# Patient Record
Sex: Female | Born: 2000 | Race: White | Hispanic: No | Marital: Single | Smoking: Never smoker
Health system: Southern US, Community
[De-identification: ages and names within clinical notes are randomized; demographics above are authoritative.]

## PROBLEM LIST (undated history)

## (undated) DIAGNOSIS — F819 Developmental disorder of scholastic skills, unspecified: Secondary | ICD-10-CM

## (undated) DIAGNOSIS — E301 Precocious puberty: Secondary | ICD-10-CM

## (undated) DIAGNOSIS — R55 Syncope and collapse: Secondary | ICD-10-CM

## (undated) DIAGNOSIS — Q828 Other specified congenital malformations of skin: Secondary | ICD-10-CM

## (undated) DIAGNOSIS — F419 Anxiety disorder, unspecified: Principal | ICD-10-CM

## (undated) HISTORY — DX: Syncope and collapse: R55

## (undated) HISTORY — DX: Developmental disorder of scholastic skills, unspecified: F81.9

## (undated) HISTORY — PX: NO PAST SURGERIES: SHX2092

## (undated) HISTORY — DX: Precocious puberty: E30.1

## (undated) HISTORY — DX: Other specified congenital malformations of skin: Q82.8

---
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
## 2018-03-23 ENCOUNTER — Other Ambulatory Visit: Payer: Self-pay | Admitting: Internal Medicine

## 2018-09-10 ENCOUNTER — Ambulatory Visit (INDEPENDENT_AMBULATORY_CARE_PROVIDER_SITE_OTHER): Payer: PRIVATE HEALTH INSURANCE | Admitting: Clinical

## 2018-09-10 DIAGNOSIS — F419 Anxiety disorder, unspecified: Secondary | ICD-10-CM

## 2022-01-03 LAB — HEMOGLOBIN A1C
Estimated Avg Glucose, External: 109 mg/dL (ref 91–123)
Estimated Avg Glucose: 109
Hemoglobin A1C, External: 5.4 % (ref 4.8–5.6)
Hemoglobin A1C: 5.4 %

## 2022-04-07 ENCOUNTER — Ambulatory Visit: Attending: Family Medicine

## 2022-04-07 NOTE — Progress Notes (Deleted)
Caitlin Lee (DOB:  May 06, 2000) is a 22 y.o. female,Established patient, here for evaluation of the following chief complaint(s):  No chief complaint on file.         ASSESSMENT/PLAN:  {There are no diagnoses linked to this encounter. (Refresh or delete this SmartLink)}    No follow-ups on file.         Subjective   SUBJECTIVE/OBJECTIVE:  HPI      Past Medical History:   Diagnosis Date    Anxiety     Memory loss     Obesity     Psoriasis      History reviewed. No pertinent surgical history.  No current outpatient medications on file.     No current facility-administered medications for this visit.         Allergies and Intolerances:   Not on File    Family History:   History reviewed. No pertinent family history.    Social History:   She  reports that she has never smoked. She has never used smokeless tobacco.  She  has no history on file for alcohol use.    Review of Systems       Objective  There were no vitals taken for this visit.    Physical Exam       {Time Documentation Optional:210461321}      An electronic signature was used to authenticate this note.    --Marijo File, MD

## 2022-07-02 ENCOUNTER — Encounter

## 2022-07-02 NOTE — Telephone Encounter (Signed)
Last Appointment:  Visit date not found  Future Appointments   Date Time Provider Department Center   11/07/2022 10:15 AM Barnabas Lister, Marcos Eke, MD [facility]

## 2022-07-04 MED ORDER — BUPROPION HCL ER (XL) 300 MG PO TB24
300 MG | ORAL_TABLET | Freq: Every morning | ORAL | 1 refills | Status: DC
Start: 2022-07-04 — End: 2023-02-06

## 2022-07-04 NOTE — Telephone Encounter (Signed)
Orders Placed This Encounter    buPROPion (WELLBUTRIN XL) 300 MG extended release tablet     Sig: Take 1 tablet by mouth every morning     Dispense:  90 tablet     Refill:  1

## 2022-11-07 ENCOUNTER — Ambulatory Visit
Admit: 2022-11-07 | Discharge: 2022-11-07 | Payer: BLUE CROSS/BLUE SHIELD | Attending: Family Medicine | Primary: Family Medicine

## 2022-11-07 DIAGNOSIS — F419 Anxiety disorder, unspecified: Secondary | ICD-10-CM

## 2022-11-07 NOTE — Progress Notes (Unsigned)
Caitlin Lee (DOB:  03/30/2000) is a 22 y.o. female,Established patient, here for evaluation of the following chief complaint(s):  Establish Care and New Patient         Assessment & Plan  Anxiety   {A/P Summary:[id number]}         Hypothyroidism, unspecified type   {A/P Summary:[id number]}         Elevated BP without diagnosis of hypertension   {A/P Summary:[id number]}           Return in about 6 months (around 05/08/2023) for Hypothyroidism, Elevated bp,  anxiety.       Subjective   HPI  22 year old female who presents to establish care.  Former patient of [employer] primary care.  Has a history of anxiety, psoriasis, hypothyroidism, and morbid obesity.        Patient Care Team:  Endocrinologist-   Truitt Leep, NP   [facility])  OB/GYN-   [facility])  Dermatologist          Anxiety----  On Wellbutrin XL  300 mg a day and lexapro 5 mg a day.    No mental health counselor.    She did not feel that this is necessary at this time.           Hypothyroidism--  Managed by Endocrinologist.     Elevated bp---  May be d/t being in the doctor's office.   Also needing to get to work on time.           Past Medical History:   Diagnosis Date    Anxiety     COVID-19 12/2020    and in 09/2022.    Had memory issues after the first COVID infection.    Memory loss     Obesity     Psoriasis      Past Surgical History:   Procedure Laterality Date    WISDOM TOOTH EXTRACTION  05/2019     Current Outpatient Medications   Medication Sig    escitalopram (LEXAPRO) 5 MG tablet Take 1 tablet by mouth daily    ALAYCEN 1/35 1-35 MG-MCG per tablet Take 1 tablet by mouth daily    levothyroxine (SYNTHROID) 25 MCG tablet Take 1 tablet by mouth daily    liothyronine (CYTOMEL) 5 MCG tablet Take 1 tablet by mouth daily    coal tar (NEUTROGENA T-GEL) 0.5 % shampoo Apply topically nightly as needed    buPROPion (WELLBUTRIN XL) 300 MG extended release tablet Take 1 tablet by mouth every morning     No  current facility-administered medications for this visit.         Allergies and Intolerances:   No Known Allergies    Family History:   No family history on file.    Social History:   She  reports that she has never smoked. She has never used smokeless tobacco.  She  has no history on file for alcohol use.        Review of Systems   Constitutional:  Negative for chills, fatigue and fever.   Eyes:  Negative for visual disturbance.   Respiratory:  Negative for cough and shortness of breath.    Cardiovascular:  Negative for chest pain, palpitations and leg swelling.   Gastrointestinal:  Negative for abdominal pain, blood in stool, nausea and vomiting.   Genitourinary:  Negative for difficulty urinating.   Neurological:  Negative for dizziness, numbness and headaches.  Objective   BP (!) 122/93 (Site: Right Upper Arm, Position: Sitting, Cuff Size: Medium Adult)   Pulse 93   Temp 97.2 F (36.2 C) (Temporal)   Resp 18   Ht 1.676 m (5\' 6" )   Wt 115.8 kg (255 lb 3.2 oz)   SpO2 97%   BMI 41.19 kg/m     Physical Exam  Vitals and nursing note reviewed.   Constitutional:       Appearance: Normal appearance. She is obese.   HENT:      Head: Normocephalic and atraumatic.   Eyes:      Extraocular Movements: Extraocular movements intact.   Cardiovascular:      Rate and Rhythm: Normal rate and regular rhythm.   Pulmonary:      Effort: Pulmonary effort is normal.      Breath sounds: Normal breath sounds.   Abdominal:      General: Abdomen is flat.      Palpations: Abdomen is soft.   Musculoskeletal:      Right lower leg: No edema.      Left lower leg: No edema.   Skin:     General: Skin is warm and dry.   Neurological:      General: No focal deficit present.      Mental Status: She is alert and oriented to person, place, and time.            {Time Documentation Optional:210461321}      An electronic signature was used to authenticate this note.    --Aldean Baker, MD

## 2023-02-06 ENCOUNTER — Encounter

## 2023-02-06 NOTE — Telephone Encounter (Signed)
Patient request a refill for buPROPion (WELLBUTRIN XL) 300 MG extended release tablet . Patient verified [employer].

## 2023-02-06 NOTE — Telephone Encounter (Signed)
Last Appointment:  11/07/2022  No future appointments.

## 2023-02-09 MED ORDER — BUPROPION HCL ER (XL) 300 MG PO TB24
300 | ORAL_TABLET | Freq: Every morning | ORAL | 1 refills | Status: AC
Start: 2023-02-09 — End: ?

## 2023-02-09 NOTE — Telephone Encounter (Signed)
Orders Placed This Encounter    buPROPion (WELLBUTRIN XL) 300 MG extended release tablet     Sig: Take 1 tablet by mouth every morning     Dispense:  90 tablet     Refill:  1       Inform patient that the above medication was refilled.    Last refills need a follow up appt.  before her 6 months of medication refills runs out.

## 2023-02-10 NOTE — Telephone Encounter (Signed)
Last Appointment:  11/07/2022  Future Appointments   Date Time Provider Department Center   05/08/2023 10:30 AM [place], Marcos Eke, MD [facility] DEP

## 2023-05-04 ENCOUNTER — Telehealth

## 2023-05-04 NOTE — Telephone Encounter (Signed)
 Caitlin Lee is requesting refills of:    escitalopram (LEXAPRO) 5 MG tablet         to be sent to   [facility] [id number] [place], [place] - [phone] [name] [phone]  [address] [place] [address]  Phone: [phone] Fax: [phone]  .     LAST OFFICE VISIT:  11/07/2022     UPCOMING APPOINTMENT(S):  Future Appointments   Date Time Provider Department Center   05/08/2023 10:30 AM [place] , Jinnie Mountain, MD [facility] ECC DEP       Patient offered an appointment? N/A  How much medication does the patient have on hand? 0    Provided notified

## 2023-05-06 NOTE — Telephone Encounter (Signed)
 Orders Placed This Encounter    escitalopram (LEXAPRO) 5 MG tablet     Sig: Take 1 tablet by mouth daily     Dispense:  30 tablet     Refill:  0     Last refill.  Need to see MD

## 2023-05-07 MED ORDER — ESCITALOPRAM OXALATE 5 MG PO TABS
5 | ORAL_TABLET | Freq: Every day | ORAL | 0 refills | 90.00000 days | Status: DC
Start: 2023-05-07 — End: 2023-05-08

## 2023-05-08 ENCOUNTER — Ambulatory Visit
Admit: 2023-05-08 | Discharge: 2023-05-08 | Payer: BLUE CROSS/BLUE SHIELD | Attending: Family Medicine | Primary: Family Medicine

## 2023-05-08 VITALS — BP 115/82 | HR 93 | Temp 97.20000°F | Resp 19 | Ht 66.0 in | Wt 257.8 lb

## 2023-05-08 DIAGNOSIS — F419 Anxiety disorder, unspecified: Secondary | ICD-10-CM

## 2023-05-08 MED ORDER — ESCITALOPRAM OXALATE 5 MG PO TABS
5 | ORAL_TABLET | Freq: Every day | ORAL | 1 refills | 90.00000 days | Status: DC
Start: 2023-05-08 — End: 2023-10-22

## 2023-05-08 MED ORDER — BUPROPION HCL ER (XL) 300 MG PO TB24
300 | ORAL_TABLET | Freq: Every morning | ORAL | 1 refills | 90.00000 days | Status: DC
Start: 2023-05-08 — End: 2023-07-13

## 2023-05-08 MED ORDER — KETOCONAZOLE 1 % EX SHAM
1 | CUTANEOUS | 4 refills | 30.00000 days | Status: DC
Start: 2023-05-08 — End: 2023-11-10

## 2023-05-08 NOTE — Progress Notes (Signed)
 Have you been to the ER, urgent care clinic since your last visit?  Hospitalized since your last visit?   NO    Have you seen or consulted any other health care providers outside our system since your last visit?   NO     "Have you had a pap smear?"    YES - Where: Dr. Alethea Andes Nurse/CMA to request most recent records if not in the chart    No cervical cancer screening on file

## 2023-05-08 NOTE — Progress Notes (Signed)
 Caitlin Lee (DOB:  [birth year]) is a 23 y.o. female,Established patient, here for evaluation of the following chief complaint(s):  Follow-up, Anxiety, and Hypothyroidism         Assessment & Plan  Anxiety  Orders:    escitalopram (LEXAPRO) 5 MG tablet; Take 1 tablet by mouth daily    buPROPion  (WELLBUTRIN  XL) 300 MG extended release tablet; Take 1 tablet by mouth every morning    Comprehensive Metabolic Panel; Future    Psoriasis of scalp  Orders:    KETOCONAZOLE, TOPICAL, 1 % SHAM; Apply to hair and bring to lather and let sit for 10-15 minutes then rinse thoroughly Twice a week.    Hypothyroidism, unspecified type  -On thyroxine 25 mcg a day  Orders:    T3, Free; Future    TSH + Free T4 Panel; Future    Comprehensive Metabolic Panel; Future    Morbid obesity with body mass index (BMI) of 40.0 to 44.9 in adult [facility])  -Recommend a low calorie diet  -Patient encouraged to exercise for 30 minutes 3 to 5 times a week.    -Recommend eating a well balanced healthy diet. (Consistent of lean meats, lots of fruits, vegetables and whole grains).    -Limit processed foods.   -Drink 32 to 64 ounces of fluid each day  -Eat 2 to 3 g of fiber each day         Screening for diabetes mellitus   Orders:    Hemoglobin A1C; Future    Encounter for lipid screening for cardiovascular disease  Orders:    Lipid Panel; Future    Encounter for medication management  Orders:    Comprehensive Metabolic Panel; Future      Return in about 6 months (around 11/07/2023) for anxiety, Hyperthyroidism  .       Subjective   23 year old female with history of anxiety, psoriasis, hypothyroidism, and morbid obesity.  Presents for follow-up of her medical conditions      Patient Care Team:  Endocrinologist-   Deniece Fire, NP   [facility])  OB/GYN-   Alethea Andes [facility])  Dermatologist-         Health Maintenance:  Pap smear--       Anxiety----  Doing well on Wellbutrin  XL  300 mg a day and lexapro 5 mg a day.  Anxiety appears to be  controlled. No mental health counselor.    She did not feel that this is necessary at this time.             Hypothyroidism--  Managed by Endocrinologist.  Her last TSH was 2.92, total T3 191 ng/dL and free T4 1.1 ng/dL on (5/40/9811).  Her thyroid peroxidase antibody was elevated at 168 IU/mL.  Denies any constipation, easy bruising, weight gain, fatigue, hair loss or muscle weakness.         Scalp Psoriasis---  Patient requesting refills on ketoconazole 1% shampoo.  She notes that this medication has been helpful in the past with her itchy/flaky scalp        Anxiety  Patient reports no chest pain, dizziness, nausea, palpitations or shortness of breath.           Past Medical History:   Diagnosis Date    Anxiety     COVID-19 12/2020    and in 09/2022.    Had memory issues after the first COVID infection.    Memory loss     Obesity     Psoriasis  Past Surgical History:   Procedure Laterality Date    WISDOM TOOTH EXTRACTION  05/2019     Current Outpatient Medications   Medication Sig    KETOCONAZOLE, TOPICAL, 1 % SHAM Apply to hair and bring to lather and let sit for 10-15 minutes then rinse thoroughly Twice a week.    escitalopram (LEXAPRO) 5 MG tablet Take 1 tablet by mouth daily    buPROPion  (WELLBUTRIN  XL) 300 MG extended release tablet Take 1 tablet by mouth every morning    ALAYCEN 1/35 1-35 MG-MCG per tablet Take 1 tablet by mouth daily    levothyroxine (SYNTHROID) 25 MCG tablet Take 1 tablet by mouth daily    liothyronine (CYTOMEL) 5 MCG tablet Take 1 tablet by mouth daily    coal tar (NEUTROGENA T-GEL) 0.5 % shampoo Apply topically nightly as needed     No current facility-administered medications for this visit.         Allergies and Intolerances:   Allergies   Allergen Reactions    Sertraline      Abdominal pain       Family History:   History reviewed. No pertinent family history.    Social History:   She  reports that she has never smoked. She has never used smokeless tobacco.  She  has no history on  file for alcohol use.      Review of Systems   Constitutional:  Negative for chills, fatigue and fever.   Eyes:  Negative for visual disturbance.   Respiratory:  Negative for cough and shortness of breath.    Cardiovascular:  Negative for chest pain, palpitations and leg swelling.   Gastrointestinal:  Negative for abdominal pain, blood in stool, nausea and vomiting.   Genitourinary:  Negative for difficulty urinating.   Neurological:  Negative for dizziness, numbness and headaches.          Objective  BP 115/82 (BP Cuff Size: Thigh)   Pulse 93   Temp 97.2 F (36.2 C) (Temporal)   Resp 19   Ht 1.676 m (5\' 6" )   Wt 116.9 kg (257 lb 12.8 oz)   SpO2 98%   BMI 41.61 kg/m     Physical Exam  Vitals and nursing note reviewed.   Constitutional:       Appearance: Normal appearance. She is obese.   HENT:      Head: Normocephalic and atraumatic.   Eyes:      Extraocular Movements: Extraocular movements intact.   Cardiovascular:      Rate and Rhythm: Normal rate and regular rhythm.   Pulmonary:      Effort: Pulmonary effort is normal.      Breath sounds: Normal breath sounds.   Abdominal:      General: Abdomen is flat.      Palpations: Abdomen is soft.   Musculoskeletal:      Right lower leg: No edema.      Left lower leg: No edema.   Skin:     General: Skin is warm and dry.   Neurological:      General: No focal deficit present.      Mental Status: She is alert and oriented to person, place, and time.            On this date 05/08/2023 I have spent 21 minutes reviewing previous notes, test results and face to face with the patient discussing the diagnosis and importance of compliance with the treatment plan as well as documenting on the  day of the visit.      An electronic signature was used to authenticate this note.    --Jinnie Mountain Carlis Blanchard , MD

## 2023-05-12 NOTE — Telephone Encounter (Signed)
 Spoke with pt in regards to lab results . Two patient identifier's verified.  Relayed the pcp's note. Pt acknowledges understanding and voices no concerns at this time.

## 2023-07-13 ENCOUNTER — Encounter

## 2023-07-13 NOTE — Telephone Encounter (Signed)
 Ms. Krinke is requesting refills of:      buPROPion  (WELLBUTRIN  XL) 300 MG extended release tablet       to be sent to   [facility] [address] [place], [place] [address]   [phone] .     LAST OFFICE VISIT:  05/08/2023     UPCOMING APPOINTMENT(S):  Future Appointments   Date Time Provider Department Center   11/10/2023  8:30 AM [place] , Darice CROME, MD [facility] ECC DEP       Patient offered an appointment? no  How much medication does the patient have on hand? No meds on hand    Provided notified

## 2023-07-14 NOTE — Telephone Encounter (Signed)
Orders Placed This Encounter    buPROPion (WELLBUTRIN XL) 300 MG extended release tablet     Sig: Take 1 tablet by mouth every morning     Dispense:  90 tablet     Refill:  1

## 2023-07-15 MED ORDER — BUPROPION HCL ER (XL) 300 MG PO TB24
300 | ORAL_TABLET | Freq: Every morning | ORAL | 1 refills | 90.00000 days | Status: DC
Start: 2023-07-15 — End: 2023-11-10

## 2023-10-22 ENCOUNTER — Encounter

## 2023-10-22 MED ORDER — ESCITALOPRAM OXALATE 5 MG PO TABS
5 | ORAL_TABLET | Freq: Every day | ORAL | 0 refills | 90.00000 days | Status: DC
Start: 2023-10-22 — End: 2023-11-10

## 2023-10-22 NOTE — Telephone Encounter (Signed)
"  Patient is requesting refill on      escitalopram  (LEXAPRO ) 5 MG tablet [[id number]]    Order Details  Dose: 5 mg Route: Oral Frequency: DAILY   Dispense Quantity: 90 tablet (90 day supply) Refills: 1    Duration: -- Dispense As Written: No    Note to Pharmacy: Last refill.  Need to see MD         Sig: Take 1 tablet by mouth daily       Future Appointments   Date Time Provider Department Center   11/10/2023  8:30 AM [place] , Darice CROME, MD [facility] ECC DEP       "

## 2023-10-22 NOTE — Telephone Encounter (Signed)
"  Orders Placed This Encounter    escitalopram  (LEXAPRO ) 5 MG tablet     Sig: Take 1 tablet by mouth daily     Dispense:  90 tablet     Refill:  0     Last refill.  Need to see MD      "

## 2023-11-10 ENCOUNTER — Ambulatory Visit
Admit: 2023-11-10 | Discharge: 2023-11-10 | Payer: BLUE CROSS/BLUE SHIELD | Attending: Family Medicine | Primary: Family Medicine

## 2023-11-10 MED ORDER — KETOCONAZOLE 1 % EX SHAM
1 | CUTANEOUS | 4 refills | 30.00000 days | Status: AC
Start: 2023-11-10 — End: ?

## 2023-11-10 MED ORDER — ESCITALOPRAM OXALATE 5 MG PO TABS
5 | ORAL_TABLET | Freq: Every day | ORAL | 1 refills | 90.00000 days | Status: AC
Start: 2023-11-10 — End: ?

## 2023-11-10 MED ORDER — BUPROPION HCL ER (XL) 300 MG PO TB24
300 | ORAL_TABLET | Freq: Every morning | ORAL | 1 refills | 90.00000 days | Status: AC
Start: 2023-11-10 — End: ?

## 2023-11-10 NOTE — Progress Notes (Signed)
 "    Caitlin Lee (DOB:  2000/07/06) is a 23 y.o. female,Established patient, here for evaluation of the following chief complaint(s):  Anxiety and Thyroid Problem (Hyperthyroidism)         Assessment & Plan  Anxiety  Controlled on bupropion  300 mg extended release once daily and escitalopram  5 mg once daily    Orders:    buPROPion  (WELLBUTRIN  XL) 300 MG extended release tablet; Take 1 tablet by mouth every morning    escitalopram  (LEXAPRO ) 5 MG tablet; Take 1 tablet by mouth daily    Psoriasis of scalp  Orders:    KETOCONAZOLE , TOPICAL, 1 % SHAM; Apply to hair and bring to lather and let sit for 10-15 minutes then rinse thoroughly Twice a week.    Hypothyroidism, unspecified type  -Managed by endocrinology specialist  -Controlled on levothyroxine 25 mcg a day and liothyronine 5 mcg a day.       Screening for diabetes mellitus  Orders:    Hemoglobin A1C; Future    Encounter for medication management  Orders:    Comprehensive Metabolic Panel; Future    Morbid obesity with body mass index (BMI) of 40.0 to 44.9 in adult [facility])  -Recommend a low calorie diet  -Patient encouraged to exercise for 30 minutes 3 to 5 times a week.    -Recommend eating a well balanced healthy diet. (Consistent of lean meats, lots of fruits, vegetables and whole grains).    -Limit processed foods.   -Drink 32 to 64 ounces of fluid each day  -Eat 2 to 3 g of fiber each day          Return in about 6 months (around 05/10/2024) for ANXIETY, THYROID. psoriasis, OV extended.       Subjective   23 year old female with history of anxiety, psoriasis, hypothyroidism, and morbid obesity.  Presents for follow-up of her medical conditions      Patient Care Team:  Endocrinologist-   Caitlin Edison, NP   [facility])  OB/GYN-   Caitlin Lee [facility])  Dermatologist-         Health Maintenance:  Pap smear--  Summer of 2025.           Anxiety----  Doing well on Wellbutrin  XL  300 mg a day and lexapro  5 mg a day.  Anxiety appears to be controlled. No  mental health counselor.    She did not feel that this is necessary at this time.             Hypothyroidism--  Managed by Endocrinologist.  Her last thyroid labs were drawn in August per patient by her Endocrinologist.  She has had no adjustments in her medication.   Denies any constipation, easy bruising, weight gain, fatigue, hair loss or muscle weakness.         Scalp Psoriasis---  Patient requesting refills on ketoconazole  1% shampoo.  She notes that this medication has been helpful in the past with her itchy/flaky scalp.      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   Takes IBU or Tylenol for the pain from OTC.   Performing stretching exercise.  She is unable to find a comfortable position.      Denies any back injury.          Anxiety  Patient reports no chest pain, dizziness, nausea, palpitations or shortness of breath.       Thyroid Problem  Patient reports no fatigue or palpitations.  Past Medical History:   Diagnosis Date    Anxiety     COVID-19 12/2020    and in 09/2022.    Had memory issues after the first COVID infection.    Memory loss     Obesity     Psoriasis      Past Surgical History:   Procedure Laterality Date    WISDOM TOOTH EXTRACTION  05/2019     Current Outpatient Medications   Medication Sig    KETOCONAZOLE , TOPICAL, 1 % SHAM Apply to hair and bring to lather and let sit for 10-15 minutes then rinse thoroughly Twice a week.    buPROPion  (WELLBUTRIN  XL) 300 MG extended release tablet Take 1 tablet by mouth every morning    escitalopram  (LEXAPRO ) 5 MG tablet Take 1 tablet by mouth daily    ALAYCEN 1/35 1-35 MG-MCG per tablet Take 1 tablet by mouth daily    levothyroxine (SYNTHROID) 25 MCG tablet Take 1 tablet by mouth daily    liothyronine (CYTOMEL) 5 MCG tablet Take 1 tablet by mouth daily    coal tar (NEUTROGENA T-GEL) 0.5 % shampoo Apply topically nightly as needed     No current facility-administered medications for this visit.         Allergies and Intolerances:   Allergies    Allergen Reactions    Sertraline      Abdominal pain       Family History:   History reviewed. No pertinent family history.    Social History:   She  reports that she has never smoked. She has never used smokeless tobacco.  She  has no history on file for alcohol use.      Review of Systems   Constitutional:  Negative for chills, fatigue and fever.   Eyes:  Negative for visual disturbance.   Respiratory:  Negative for cough and shortness of breath.    Cardiovascular:  Negative for chest pain, palpitations and leg swelling.   Gastrointestinal:  Negative for abdominal pain, blood in stool, nausea and vomiting.   Genitourinary:  Negative for difficulty urinating.   Neurological:  Negative for dizziness, numbness and headaches.          Objective  BP 111/72   Pulse 87   Temp 97.3 F (36.3 C) (Temporal)   Resp 18   Ht 1.676 m (5' 6)   Wt 114.7 kg (252 lb 12.8 oz)   SpO2 98%   BMI 40.80 kg/m       Physical Exam  Vitals and nursing note reviewed.   Constitutional:       Appearance: Normal appearance. She is obese.   HENT:      Head: Normocephalic and atraumatic.   Eyes:      Extraocular Movements: Extraocular movements intact.   Cardiovascular:      Rate and Rhythm: Normal rate and regular rhythm.   Pulmonary:      Effort: Pulmonary effort is normal.      Breath sounds: Normal breath sounds.   Abdominal:      General: Abdomen is flat.      Palpations: Abdomen is soft.   Musculoskeletal:      Right lower leg: No edema.      Left lower leg: No edema.   Skin:     General: Skin is warm and dry.   Neurological:      General: No focal deficit present.      Mental Status: She is alert and oriented to person,  place, and time.            On this date 11/10/2023 I have spent 32 minutes reviewing previous notes, test results and face to face with the patient discussing the diagnosis and importance of compliance with the treatment plan as well as documenting on the day of the visit.      An electronic signature was used to  authenticate this note.    --Darice CROME Peyton Spengler , MD   "

## 2023-11-10 NOTE — Progress Notes (Signed)
"  Caitlin Lee is a 23 y.o. year old female who presents today for   Chief Complaint   Patient presents with    Anxiety    Thyroid Problem     Hyperthyroidism       Have you been to the ER, urgent care clinic since your last visit?  Hospitalized since your last visit?    Yes, [facility]    Have you seen or consulted any other health care providers outside our system since your last visit?    No     Have you had a pap smear?    YES - Where: [employer] [facility] Nurse/CMA to request most recent records if not in the chart    No cervical cancer screening on file     \    Click Here for Release of Records Request    Mardy Jacquet, [facility]  [facility]  [address]  [place], [address]  Ph: [phone]  Fax: [phone]  "

## 2023-11-11 LAB — COMPREHENSIVE METABOLIC PANEL
ALT: 16 IU/L (ref 0–32)
AST: 11 IU/L (ref 0–40)
Albumin: 4.2 g/dL (ref 4.0–5.0)
Alkaline Phosphatase: 96 IU/L (ref 41–116)
BUN/Creatinine Ratio: 17 (ref 9–23)
BUN: 11 mg/dL (ref 6–20)
CO2: 21 mmol/L (ref 20–29)
Calcium: 9 mg/dL (ref 8.7–10.2)
Chloride: 103 mmol/L (ref 96–106)
Creatinine: 0.65 mg/dL (ref 0.57–1.00)
Est, Glom Filt Rate: 125 mL/min/1.73 (ref >59)
Globulin, Total: 2.2 g/dL (ref 1.5–4.5)
Glucose: 85 mg/dL (ref 70–99)
Potassium: 4.1 mmol/L (ref 3.5–5.2)
Sodium: 138 mmol/L (ref 134–144)
Total Bilirubin: 0.3 mg/dL (ref 0.0–1.2)
Total Protein: 6.4 g/dL (ref 6.0–8.5)

## 2023-11-11 LAB — HEMOGLOBIN A1C
Estimated Avg Glucose: 114 mg/dL
Hemoglobin A1C: 5.6 % (ref 4.8–5.6)
